# Patient Record
Sex: Female | Born: 2000 | Race: White | Hispanic: No | Marital: Single | Smoking: Never smoker
Health system: Southern US, Community
[De-identification: ages and names within clinical notes are randomized; demographics above are authoritative.]

---
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
## 2024-09-04 ENCOUNTER — Ambulatory Visit: Admitting: Family Medicine
